# Patient Record
Sex: Female | Born: 1960 | Race: White | Hispanic: No | Marital: Married | Smoking: Never smoker
Health system: Southern US, Community
[De-identification: ages and names within clinical notes are randomized; demographics above are authoritative.]

## PROBLEM LIST (undated history)

## (undated) DIAGNOSIS — D689 Coagulation defect, unspecified: Secondary | ICD-10-CM

## (undated) DIAGNOSIS — C439 Malignant melanoma of skin, unspecified: Secondary | ICD-10-CM

## (undated) DIAGNOSIS — F419 Anxiety disorder, unspecified: Secondary | ICD-10-CM

## (undated) DIAGNOSIS — I10 Essential (primary) hypertension: Secondary | ICD-10-CM

## (undated) HISTORY — DX: Malignant melanoma of skin, unspecified: C43.9

## (undated) HISTORY — DX: Anxiety disorder, unspecified: F41.9

## (undated) HISTORY — DX: Coagulation defect, unspecified: D68.9

## (undated) HISTORY — PX: MELANOMA EXCISION: SHX5266

## (undated) HISTORY — DX: Essential (primary) hypertension: I10

## (undated) HISTORY — PX: HERNIA REPAIR: SHX51

## (undated) HISTORY — PX: OTHER SURGICAL HISTORY: SHX169

## (undated) HISTORY — PX: COLONOSCOPY: SHX174

---

## 2005-11-03 ENCOUNTER — Ambulatory Visit: Payer: Self-pay | Admitting: Internal Medicine

## 2009-02-20 ENCOUNTER — Encounter: Admission: RE | Admit: 2009-02-20 | Discharge: 2009-02-20 | Payer: Self-pay | Admitting: Obstetrics and Gynecology

## 2009-09-30 ENCOUNTER — Ambulatory Visit: Payer: Self-pay | Admitting: Internal Medicine

## 2009-09-30 ENCOUNTER — Encounter (INDEPENDENT_AMBULATORY_CARE_PROVIDER_SITE_OTHER): Payer: Self-pay | Admitting: *Deleted

## 2009-10-14 ENCOUNTER — Ambulatory Visit: Payer: Self-pay | Admitting: Internal Medicine

## 2009-10-14 LAB — HM COLONOSCOPY

## 2010-03-02 ENCOUNTER — Encounter: Admission: RE | Admit: 2010-03-02 | Discharge: 2010-03-02 | Payer: Self-pay | Admitting: Obstetrics and Gynecology

## 2010-08-19 ENCOUNTER — Encounter: Payer: Self-pay | Admitting: Internal Medicine

## 2010-12-22 NOTE — Letter (Signed)
Summary: [facility]   Imported By: Sherian Rein 08/25/2010 13:54:20  _____________________________________________________________________  External Attachment:    Type:   Image     Comment:   External Document

## 2010-12-24 ENCOUNTER — Ambulatory Visit (INDEPENDENT_AMBULATORY_CARE_PROVIDER_SITE_OTHER): Payer: Private Health Insurance - Indemnity | Admitting: Internal Medicine

## 2010-12-24 ENCOUNTER — Ambulatory Visit (INDEPENDENT_AMBULATORY_CARE_PROVIDER_SITE_OTHER)
Admission: RE | Admit: 2010-12-24 | Discharge: 2010-12-24 | Disposition: A | Discharge status: Home / Self Care | Payer: Private Health Insurance - Indemnity | Source: Ambulatory Visit | Attending: Internal Medicine | Admitting: Internal Medicine

## 2010-12-24 ENCOUNTER — Other Ambulatory Visit: Payer: Self-pay | Admitting: Internal Medicine

## 2010-12-24 ENCOUNTER — Encounter: Payer: Self-pay | Admitting: Internal Medicine

## 2010-12-24 DIAGNOSIS — M25519 Pain in unspecified shoulder: Secondary | ICD-10-CM

## 2011-01-07 NOTE — Assessment & Plan Note (Signed)
Summary: 3 MTH SHOULDER PAIN--LAST APPT W/DRAVP:  2006---[facility]   Vital Signs:  Patient profile:   50 year old female Height:      67 inches Weight:      179 pounds BMI:     28.14 Temp:     98.5 degrees F oral Pulse rate:   80 / minute Pulse rhythm:   regular Resp:     16 per minute BP sitting:   150 / 98  (right arm) Cuff size:   large  Vitals Entered By: Lanier Prude, CMA(AAMA) (December 24, 2010 9:43 AM) CC: Lt shoulder/arm  pain X 3 mo Is Patient Diabetic? No   CC:  Lt shoulder/arm  pain X 3 mo.  History of Present Illness: New pt to reest - last seen in 2006 C/o severe L shoulder pain irrad down the upper arm; it would  improve w/Aleve. she has been having it since Oct; pain getting worse. No injury. No fever or swelling  Preventive Screening-Counseling & Management  Alcohol-Tobacco     Smoking Status: never  Caffeine-Diet-Exercise     Does Patient Exercise: yes  Current Medications (verified): 1)  Vitamin B-12 Cr 2000 Mcg Cr-Tabs (Cyanocobalamin) .Marland Kitchen.. 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    Physical Exam  General:  Well-developed,well-nourished,in no acute distress; alert,appropriate and cooperative throughout examination Eyes:  No corneal or conjunctival inflammation noted. EOMI. Perrla.  Mouth:  Oral mucosa and oropharynx without lesions or exudates.  Teeth in good repair. Neck:   No deformities, masses, or tenderness noted. Lungs:  Normal respiratory effort, chest expands symmetrically. Lungs are clear to auscultation, no crackles or wheezes. Heart:  Normal rate and regular rhythm. S1 and S2 normal without gallop, murmur, click, rub or other extra sounds. Abdomen:  Bowel sounds positive,abdomen soft and non-tender without masses, organomegaly or hernias noted. Msk:  L subacr space is tender with palpation and ROM Neck WNL, more with abduction. Neurologic:  MS OK No atrophy DTRs ok Skin:  scar over L scapula   Impression & Recommendations:  Problem # 1:  SHOULDER PAIN (ICD-719.41) - subacr. bursitis Assessment New Options discussed: injection vs by mouth meds. She would like to try meds first. Her updated medication list for this problem includes:    Tramadol Hcl 50 Mg Tabs (Tramadol hcl) .Marland Kitchen... 1-2 tabs by mouth two times a day as needed pain Take Predn. 40mg  qd for 3 days, then 20 mg qd for 3 days, then 10mg  qd for 6 days, then stop. Take pc. Take Nexium 40 mg  1 a day x 2 wks Pennsaid  PT if needed Orders: T-Shoulder Left Min 2 Views (73030TC) Exercises provided  Problem # 2:  Preventive Health Care (ICD-V70.0) Assessment: Comment Only Labs and exams q 12 months w/Dr Edward Jolly  Complete Medication List: 1)  Prednisone 10 Mg Tabs (Prednisone) .... Take 40mg  qd for 3 days, then 20 mg qd for 3 days, then 10mg  qd for 6 days, then stop.  take pc. 2)  Tramadol Hcl 50 Mg Tabs (Tramadol hcl) .Marland Kitchen.. 1-2 tabs by mouth two times a day as needed pain 3)  Pennsaid 1.5 % Soln (Diclofenac sodium) .... 3-5 gtt on skin three times a day for pain 4)  Vitamin D 1000 Unit Tabs (Cholecalciferol) .Marland Kitchen.. 1 by mouth qd  Patient Instructions: 1)  Use stretching exercises that I have provided 2)  Please schedule a follow-up appointment in 1 month. Prescriptions: PENNSAID 1.5 % SOLN (DICLOFENAC SODIUM) 3-5 gtt on skin three times a day for pain  #1 x 3   Entered and Authorized by:    Tresa Garter MD   Signed by:   Tresa Garter MD on 12/24/2010   Method used:   Print then Give to Patient   RxID:   [id number] TRAMADOL HCL 50 MG TABS (TRAMADOL HCL) 1-2 tabs by mouth two times a day as needed pain  #100 x 1   Entered and Authorized by:   Tresa Garter MD   Signed by:   Tresa Garter MD on 12/24/2010   Method used:   Electronically to        [facility]. [phone]* (retail)       [address]       [place], [place]  [address]       Ph: [phone]       Fax: [phone]   RxID:   [id number] PREDNISONE 10 MG TABS (PREDNISONE) Take 40mg  qd for 3 days, then 20 mg qd for 3 days, then 10mg  qd for 6 days, then stop. Take pc.  #24 x 1   Entered and Authorized by:   Tresa Garter MD   Signed by:   Tresa Garter MD on 12/24/2010   Method used:   Electronically to        [facility]. [phone]* (retail)       [address]       [place], [place]  [address]       Ph: [phone]       Fax: [phone]   RxID:   [id number]    Orders Added: 1)  T-Shoulder Left Min 2 Views [73030TC] 2)  New Patient Level III [phone]

## 2011-01-25 ENCOUNTER — Ambulatory Visit: Payer: Private Health Insurance - Indemnity | Admitting: Internal Medicine

## 2011-02-26 ENCOUNTER — Other Ambulatory Visit: Payer: Self-pay | Admitting: Obstetrics and Gynecology

## 2011-02-26 DIAGNOSIS — Z1231 Encounter for screening mammogram for malignant neoplasm of breast: Secondary | ICD-10-CM

## 2011-03-01 ENCOUNTER — Ambulatory Visit: Payer: Private Health Insurance - Indemnity | Admitting: Internal Medicine

## 2011-03-22 ENCOUNTER — Ambulatory Visit
Admission: RE | Admit: 2011-03-22 | Discharge: 2011-03-22 | Disposition: A | Discharge status: Home / Self Care | Payer: Private Health Insurance - Indemnity | Source: Ambulatory Visit | Attending: Obstetrics and Gynecology | Admitting: Obstetrics and Gynecology

## 2011-03-22 DIAGNOSIS — Z1231 Encounter for screening mammogram for malignant neoplasm of breast: Secondary | ICD-10-CM

## 2011-05-10 ENCOUNTER — Telehealth: Payer: Self-pay

## 2011-05-10 NOTE — Telephone Encounter (Signed)
Patient called and lmovm stating that she was seen for shoulder pain (2 mos ago). She continues to have the pain and would like a referral to see a rheumatologist.Please advise Thanks

## 2011-05-10 NOTE — Telephone Encounter (Signed)
Let me see her for an OV pls

## 2011-05-11 NOTE — Telephone Encounter (Signed)
Returned call to patient//lmovm to call back to set appt with pcp

## 2011-06-28 ENCOUNTER — Ambulatory Visit: Payer: 59 | Attending: Family Medicine | Admitting: Physical Therapy

## 2011-06-28 DIAGNOSIS — IMO0001 Reserved for inherently not codable concepts without codable children: Secondary | ICD-10-CM | POA: Insufficient documentation

## 2011-06-28 DIAGNOSIS — M25619 Stiffness of unspecified shoulder, not elsewhere classified: Secondary | ICD-10-CM | POA: Insufficient documentation

## 2011-06-28 DIAGNOSIS — M6281 Muscle weakness (generalized): Secondary | ICD-10-CM | POA: Insufficient documentation

## 2011-06-28 DIAGNOSIS — M25519 Pain in unspecified shoulder: Secondary | ICD-10-CM | POA: Insufficient documentation

## 2011-07-06 ENCOUNTER — Ambulatory Visit: Payer: 59 | Admitting: Physical Therapy

## 2011-07-08 ENCOUNTER — Ambulatory Visit: Payer: 59 | Admitting: Physical Therapy

## 2011-07-13 ENCOUNTER — Ambulatory Visit: Payer: 59 | Admitting: Physical Therapy

## 2011-07-22 ENCOUNTER — Encounter: Payer: 59 | Admitting: Physical Therapy

## 2011-07-27 ENCOUNTER — Ambulatory Visit: Payer: 59 | Attending: Family Medicine | Admitting: Physical Therapy

## 2011-07-27 DIAGNOSIS — IMO0001 Reserved for inherently not codable concepts without codable children: Secondary | ICD-10-CM | POA: Insufficient documentation

## 2011-07-27 DIAGNOSIS — M6281 Muscle weakness (generalized): Secondary | ICD-10-CM | POA: Insufficient documentation

## 2011-07-27 DIAGNOSIS — M25619 Stiffness of unspecified shoulder, not elsewhere classified: Secondary | ICD-10-CM | POA: Insufficient documentation

## 2011-07-27 DIAGNOSIS — M25519 Pain in unspecified shoulder: Secondary | ICD-10-CM | POA: Insufficient documentation

## 2011-07-29 ENCOUNTER — Ambulatory Visit: Payer: 59 | Admitting: Physical Therapy

## 2011-08-03 ENCOUNTER — Encounter: Payer: 59 | Admitting: Physical Therapy

## 2011-08-05 ENCOUNTER — Encounter: Payer: 59 | Admitting: Physical Therapy

## 2011-08-12 ENCOUNTER — Ambulatory Visit: Payer: 59 | Admitting: Physical Therapy

## 2012-04-19 ENCOUNTER — Other Ambulatory Visit: Payer: Self-pay | Admitting: Family Medicine

## 2012-04-19 DIAGNOSIS — Z1231 Encounter for screening mammogram for malignant neoplasm of breast: Secondary | ICD-10-CM

## 2012-05-08 ENCOUNTER — Ambulatory Visit
Admission: RE | Admit: 2012-05-08 | Discharge: 2012-05-08 | Disposition: A | Discharge status: Home / Self Care | Payer: 59 | Source: Ambulatory Visit | Attending: Family Medicine | Admitting: Family Medicine

## 2012-05-08 DIAGNOSIS — Z1231 Encounter for screening mammogram for malignant neoplasm of breast: Secondary | ICD-10-CM

## 2013-04-13 ENCOUNTER — Other Ambulatory Visit: Payer: Self-pay

## 2013-04-13 DIAGNOSIS — Z1231 Encounter for screening mammogram for malignant neoplasm of breast: Secondary | ICD-10-CM

## 2013-06-04 ENCOUNTER — Ambulatory Visit
Admission: RE | Admit: 2013-06-04 | Discharge: 2013-06-04 | Disposition: A | Discharge status: Home / Self Care | Payer: 59 | Source: Ambulatory Visit

## 2013-06-04 DIAGNOSIS — Z1231 Encounter for screening mammogram for malignant neoplasm of breast: Secondary | ICD-10-CM

## 2014-06-12 ENCOUNTER — Other Ambulatory Visit: Payer: Self-pay

## 2014-06-12 DIAGNOSIS — Z1231 Encounter for screening mammogram for malignant neoplasm of breast: Secondary | ICD-10-CM

## 2014-06-19 ENCOUNTER — Ambulatory Visit
Admission: RE | Admit: 2014-06-19 | Discharge: 2014-06-19 | Disposition: A | Discharge status: Home / Self Care | Payer: 59 | Source: Ambulatory Visit

## 2014-06-19 ENCOUNTER — Encounter (INDEPENDENT_AMBULATORY_CARE_PROVIDER_SITE_OTHER): Payer: Self-pay

## 2014-06-19 DIAGNOSIS — Z1231 Encounter for screening mammogram for malignant neoplasm of breast: Secondary | ICD-10-CM

## 2015-05-28 ENCOUNTER — Other Ambulatory Visit: Payer: Self-pay

## 2015-05-28 DIAGNOSIS — Z1231 Encounter for screening mammogram for malignant neoplasm of breast: Secondary | ICD-10-CM

## 2015-06-19 ENCOUNTER — Encounter: Payer: Self-pay | Admitting: Internal Medicine

## 2015-06-30 ENCOUNTER — Ambulatory Visit
Admission: RE | Admit: 2015-06-30 | Discharge: 2015-06-30 | Disposition: A | Discharge status: Home / Self Care | Payer: 59 | Source: Ambulatory Visit

## 2015-06-30 DIAGNOSIS — Z1231 Encounter for screening mammogram for malignant neoplasm of breast: Secondary | ICD-10-CM

## 2015-07-02 ENCOUNTER — Other Ambulatory Visit: Payer: Self-pay | Admitting: Obstetrics and Gynecology

## 2015-07-02 DIAGNOSIS — R928 Other abnormal and inconclusive findings on diagnostic imaging of breast: Secondary | ICD-10-CM

## 2015-07-04 ENCOUNTER — Ambulatory Visit
Admission: RE | Admit: 2015-07-04 | Discharge: 2015-07-04 | Disposition: A | Discharge status: Home / Self Care | Payer: 59 | Source: Ambulatory Visit | Attending: Obstetrics and Gynecology | Admitting: Obstetrics and Gynecology

## 2015-07-04 DIAGNOSIS — R928 Other abnormal and inconclusive findings on diagnostic imaging of breast: Secondary | ICD-10-CM

## 2016-06-25 ENCOUNTER — Other Ambulatory Visit: Payer: Self-pay | Admitting: Family Medicine

## 2016-06-25 DIAGNOSIS — Z1231 Encounter for screening mammogram for malignant neoplasm of breast: Secondary | ICD-10-CM

## 2016-07-05 ENCOUNTER — Ambulatory Visit
Admission: RE | Admit: 2016-07-05 | Discharge: 2016-07-05 | Disposition: A | Discharge status: Home / Self Care | Payer: Managed Care, Other (non HMO) | Source: Ambulatory Visit | Attending: Family Medicine | Admitting: Family Medicine

## 2016-07-05 DIAGNOSIS — Z1231 Encounter for screening mammogram for malignant neoplasm of breast: Secondary | ICD-10-CM

## 2017-09-06 ENCOUNTER — Other Ambulatory Visit: Payer: Self-pay | Admitting: Family Medicine

## 2017-09-06 DIAGNOSIS — Z1231 Encounter for screening mammogram for malignant neoplasm of breast: Secondary | ICD-10-CM

## 2017-09-26 ENCOUNTER — Ambulatory Visit
Admission: RE | Admit: 2017-09-26 | Discharge: 2017-09-26 | Disposition: A | Discharge status: Home / Self Care | Payer: Managed Care, Other (non HMO) | Source: Ambulatory Visit | Attending: Family Medicine | Admitting: Family Medicine

## 2017-09-26 ENCOUNTER — Ambulatory Visit: Payer: Managed Care, Other (non HMO)

## 2017-09-26 DIAGNOSIS — Z1231 Encounter for screening mammogram for malignant neoplasm of breast: Secondary | ICD-10-CM

## 2018-01-05 DIAGNOSIS — Z79899 Other long term (current) drug therapy: Secondary | ICD-10-CM | POA: Diagnosis not present

## 2018-01-05 DIAGNOSIS — F4010 Social phobia, unspecified: Secondary | ICD-10-CM | POA: Diagnosis not present

## 2018-01-05 DIAGNOSIS — F902 Attention-deficit hyperactivity disorder, combined type: Secondary | ICD-10-CM | POA: Diagnosis not present

## 2018-03-27 DIAGNOSIS — F902 Attention-deficit hyperactivity disorder, combined type: Secondary | ICD-10-CM | POA: Diagnosis not present

## 2018-03-27 DIAGNOSIS — Z79899 Other long term (current) drug therapy: Secondary | ICD-10-CM | POA: Diagnosis not present

## 2018-05-08 DIAGNOSIS — D485 Neoplasm of uncertain behavior of skin: Secondary | ICD-10-CM | POA: Diagnosis not present

## 2018-05-09 DIAGNOSIS — D485 Neoplasm of uncertain behavior of skin: Secondary | ICD-10-CM | POA: Diagnosis not present

## 2018-06-16 DIAGNOSIS — S30861A Insect bite (nonvenomous) of abdominal wall, initial encounter: Secondary | ICD-10-CM | POA: Diagnosis not present

## 2018-06-16 DIAGNOSIS — M2550 Pain in unspecified joint: Secondary | ICD-10-CM | POA: Diagnosis not present

## 2018-06-27 DIAGNOSIS — F902 Attention-deficit hyperactivity disorder, combined type: Secondary | ICD-10-CM | POA: Diagnosis not present

## 2018-06-27 DIAGNOSIS — Z79899 Other long term (current) drug therapy: Secondary | ICD-10-CM | POA: Diagnosis not present

## 2018-06-27 DIAGNOSIS — F4010 Social phobia, unspecified: Secondary | ICD-10-CM | POA: Diagnosis not present

## 2018-07-11 DIAGNOSIS — L821 Other seborrheic keratosis: Secondary | ICD-10-CM | POA: Diagnosis not present

## 2018-07-11 DIAGNOSIS — L814 Other melanin hyperpigmentation: Secondary | ICD-10-CM | POA: Diagnosis not present

## 2018-07-11 DIAGNOSIS — D1801 Hemangioma of skin and subcutaneous tissue: Secondary | ICD-10-CM | POA: Diagnosis not present

## 2018-07-11 DIAGNOSIS — Z85820 Personal history of malignant melanoma of skin: Secondary | ICD-10-CM | POA: Diagnosis not present

## 2018-09-18 DIAGNOSIS — I10 Essential (primary) hypertension: Secondary | ICD-10-CM | POA: Diagnosis not present

## 2018-09-18 DIAGNOSIS — E559 Vitamin D deficiency, unspecified: Secondary | ICD-10-CM | POA: Diagnosis not present

## 2018-09-18 DIAGNOSIS — R7301 Impaired fasting glucose: Secondary | ICD-10-CM | POA: Diagnosis not present

## 2018-09-18 DIAGNOSIS — Z0000 Encounter for general adult medical examination without abnormal findings: Secondary | ICD-10-CM | POA: Diagnosis not present

## 2018-09-18 DIAGNOSIS — F339 Major depressive disorder, recurrent, unspecified: Secondary | ICD-10-CM | POA: Diagnosis not present

## 2018-09-22 ENCOUNTER — Other Ambulatory Visit: Payer: Self-pay | Admitting: Family Medicine

## 2018-09-22 DIAGNOSIS — Z1231 Encounter for screening mammogram for malignant neoplasm of breast: Secondary | ICD-10-CM

## 2018-09-27 DIAGNOSIS — Z79899 Other long term (current) drug therapy: Secondary | ICD-10-CM | POA: Diagnosis not present

## 2018-09-27 DIAGNOSIS — F902 Attention-deficit hyperactivity disorder, combined type: Secondary | ICD-10-CM | POA: Diagnosis not present

## 2018-09-27 DIAGNOSIS — F4010 Social phobia, unspecified: Secondary | ICD-10-CM | POA: Diagnosis not present

## 2018-11-03 ENCOUNTER — Ambulatory Visit
Admission: RE | Admit: 2018-11-03 | Discharge: 2018-11-03 | Disposition: A | Discharge status: Home / Self Care | Payer: BLUE CROSS/BLUE SHIELD | Source: Ambulatory Visit | Attending: Family Medicine | Admitting: Family Medicine

## 2018-11-03 DIAGNOSIS — Z1231 Encounter for screening mammogram for malignant neoplasm of breast: Secondary | ICD-10-CM | POA: Diagnosis not present

## 2018-11-29 DIAGNOSIS — Z6828 Body mass index (BMI) 28.0-28.9, adult: Secondary | ICD-10-CM | POA: Diagnosis not present

## 2018-11-29 DIAGNOSIS — Z01419 Encounter for gynecological examination (general) (routine) without abnormal findings: Secondary | ICD-10-CM | POA: Diagnosis not present

## 2018-11-29 DIAGNOSIS — Z124 Encounter for screening for malignant neoplasm of cervix: Secondary | ICD-10-CM | POA: Diagnosis not present

## 2019-01-03 DIAGNOSIS — I10 Essential (primary) hypertension: Secondary | ICD-10-CM | POA: Diagnosis not present

## 2019-01-03 DIAGNOSIS — F4010 Social phobia, unspecified: Secondary | ICD-10-CM | POA: Diagnosis not present

## 2019-01-03 DIAGNOSIS — Z79899 Other long term (current) drug therapy: Secondary | ICD-10-CM | POA: Diagnosis not present

## 2019-01-03 DIAGNOSIS — F902 Attention-deficit hyperactivity disorder, combined type: Secondary | ICD-10-CM | POA: Diagnosis not present

## 2019-03-26 DIAGNOSIS — E559 Vitamin D deficiency, unspecified: Secondary | ICD-10-CM | POA: Diagnosis not present

## 2019-03-26 DIAGNOSIS — I10 Essential (primary) hypertension: Secondary | ICD-10-CM | POA: Diagnosis not present

## 2019-03-26 DIAGNOSIS — R7301 Impaired fasting glucose: Secondary | ICD-10-CM | POA: Diagnosis not present

## 2019-03-26 DIAGNOSIS — F909 Attention-deficit hyperactivity disorder, unspecified type: Secondary | ICD-10-CM | POA: Diagnosis not present

## 2019-04-03 DIAGNOSIS — E559 Vitamin D deficiency, unspecified: Secondary | ICD-10-CM | POA: Diagnosis not present

## 2019-04-03 DIAGNOSIS — F4010 Social phobia, unspecified: Secondary | ICD-10-CM | POA: Diagnosis not present

## 2019-04-03 DIAGNOSIS — E7800 Pure hypercholesterolemia, unspecified: Secondary | ICD-10-CM | POA: Diagnosis not present

## 2019-04-03 DIAGNOSIS — Z79899 Other long term (current) drug therapy: Secondary | ICD-10-CM | POA: Diagnosis not present

## 2019-04-03 DIAGNOSIS — S30864A Insect bite (nonvenomous) of vagina and vulva, initial encounter: Secondary | ICD-10-CM | POA: Diagnosis not present

## 2019-04-03 DIAGNOSIS — R7301 Impaired fasting glucose: Secondary | ICD-10-CM | POA: Diagnosis not present

## 2019-04-03 DIAGNOSIS — F902 Attention-deficit hyperactivity disorder, combined type: Secondary | ICD-10-CM | POA: Diagnosis not present

## 2019-04-06 DIAGNOSIS — E559 Vitamin D deficiency, unspecified: Secondary | ICD-10-CM | POA: Diagnosis not present

## 2019-04-06 DIAGNOSIS — R7301 Impaired fasting glucose: Secondary | ICD-10-CM | POA: Diagnosis not present

## 2019-04-06 DIAGNOSIS — E7800 Pure hypercholesterolemia, unspecified: Secondary | ICD-10-CM | POA: Diagnosis not present

## 2019-04-06 DIAGNOSIS — Z8249 Family history of ischemic heart disease and other diseases of the circulatory system: Secondary | ICD-10-CM | POA: Diagnosis not present

## 2019-07-10 DIAGNOSIS — Z79899 Other long term (current) drug therapy: Secondary | ICD-10-CM | POA: Diagnosis not present

## 2019-07-10 DIAGNOSIS — F902 Attention-deficit hyperactivity disorder, combined type: Secondary | ICD-10-CM | POA: Diagnosis not present

## 2019-07-16 DIAGNOSIS — B078 Other viral warts: Secondary | ICD-10-CM | POA: Diagnosis not present

## 2019-07-16 DIAGNOSIS — Z85820 Personal history of malignant melanoma of skin: Secondary | ICD-10-CM | POA: Diagnosis not present

## 2019-07-16 DIAGNOSIS — L821 Other seborrheic keratosis: Secondary | ICD-10-CM | POA: Diagnosis not present

## 2019-07-16 DIAGNOSIS — L814 Other melanin hyperpigmentation: Secondary | ICD-10-CM | POA: Diagnosis not present

## 2019-09-12 ENCOUNTER — Encounter: Payer: Self-pay | Admitting: Gastroenterology

## 2019-09-20 DIAGNOSIS — R7301 Impaired fasting glucose: Secondary | ICD-10-CM | POA: Diagnosis not present

## 2019-09-20 DIAGNOSIS — E7800 Pure hypercholesterolemia, unspecified: Secondary | ICD-10-CM | POA: Diagnosis not present

## 2019-09-20 DIAGNOSIS — E559 Vitamin D deficiency, unspecified: Secondary | ICD-10-CM | POA: Diagnosis not present

## 2019-09-20 DIAGNOSIS — I10 Essential (primary) hypertension: Secondary | ICD-10-CM | POA: Diagnosis not present

## 2019-10-09 DIAGNOSIS — Z20828 Contact with and (suspected) exposure to other viral communicable diseases: Secondary | ICD-10-CM | POA: Diagnosis not present

## 2019-10-10 DIAGNOSIS — Z79899 Other long term (current) drug therapy: Secondary | ICD-10-CM | POA: Diagnosis not present

## 2019-10-10 DIAGNOSIS — F902 Attention-deficit hyperactivity disorder, combined type: Secondary | ICD-10-CM | POA: Diagnosis not present

## 2020-01-26 ENCOUNTER — Ambulatory Visit: Payer: Self-pay | Attending: Internal Medicine

## 2020-01-26 DIAGNOSIS — Z23 Encounter for immunization: Secondary | ICD-10-CM | POA: Insufficient documentation

## 2020-01-26 NOTE — Progress Notes (Signed)
   Covid-19 Vaccination Clinic  Name:  Regina Parks    MRN: [id number] DOB: [birth year]  01/26/2020  [name] was observed post Covid-19 immunization for 15 minutes without incident. She was provided with Vaccine Information Sheet and instruction to access the V-Safe system.   [name] was instructed to call 911 with any severe reactions post vaccine: Marland Kitchen Difficulty breathing  . Swelling of face and throat  . A fast heartbeat  . A bad rash all over body  . Dizziness and weakness   Immunizations Administered    Name Date Dose VIS Date Route   Pfizer COVID-19 Vaccine 01/26/2020  7:08 PM 0.3 mL 11/02/2019 Intramuscular   Manufacturer: [place]   Lot: [id number]   [place]: [id number]

## 2020-02-16 ENCOUNTER — Ambulatory Visit: Payer: Self-pay | Attending: Internal Medicine

## 2020-02-16 ENCOUNTER — Ambulatory Visit: Payer: Self-pay

## 2020-02-16 DIAGNOSIS — Z23 Encounter for immunization: Secondary | ICD-10-CM

## 2020-02-16 NOTE — Progress Notes (Signed)
   Covid-19 Vaccination Clinic  Name:  Tomekia Biskner    MRN: [id number] DOB: 1961/03/08  02/16/2020  Ms. Javorsky was observed post Covid-19 immunization for 15 minutes without incident. She was provided with Vaccine Information Sheet and instruction to access the V-Safe system.   Ms. Gerl was instructed to call 911 with any severe reactions post vaccine: Marland Kitchen Difficulty breathing  . Swelling of face and throat  . A fast heartbeat  . A bad rash all over body  . Dizziness and weakness   Immunizations Administered    Name Date Dose VIS Date Route   Pfizer COVID-19 Vaccine 02/16/2020  1:59 PM 0.3 mL 11/02/2019 Intramuscular   Manufacturer: [employer], [employer]   Lot: [id number]   [place]: [id number]

## 2020-02-27 ENCOUNTER — Ambulatory Visit: Payer: Self-pay

## 2020-08-06 ENCOUNTER — Encounter (HOSPITAL_BASED_OUTPATIENT_CLINIC_OR_DEPARTMENT_OTHER): Payer: Self-pay

## 2020-08-12 ENCOUNTER — Other Ambulatory Visit (HOSPITAL_BASED_OUTPATIENT_CLINIC_OR_DEPARTMENT_OTHER): Payer: Self-pay | Admitting: Family Medicine

## 2020-08-12 DIAGNOSIS — Z1231 Encounter for screening mammogram for malignant neoplasm of breast: Secondary | ICD-10-CM

## 2020-08-13 ENCOUNTER — Other Ambulatory Visit: Payer: Self-pay

## 2020-08-13 ENCOUNTER — Ambulatory Visit (HOSPITAL_BASED_OUTPATIENT_CLINIC_OR_DEPARTMENT_OTHER)
Admission: RE | Admit: 2020-08-13 | Discharge: 2020-08-13 | Disposition: A | Discharge status: Home / Self Care | Payer: 59 | Source: Ambulatory Visit | Attending: Family Medicine | Admitting: Family Medicine

## 2020-08-13 DIAGNOSIS — Z1231 Encounter for screening mammogram for malignant neoplasm of breast: Secondary | ICD-10-CM | POA: Diagnosis present

## 2021-11-05 ENCOUNTER — Other Ambulatory Visit (HOSPITAL_BASED_OUTPATIENT_CLINIC_OR_DEPARTMENT_OTHER): Payer: Self-pay | Admitting: Family Medicine

## 2021-11-05 DIAGNOSIS — Z1231 Encounter for screening mammogram for malignant neoplasm of breast: Secondary | ICD-10-CM

## 2021-11-10 ENCOUNTER — Ambulatory Visit (HOSPITAL_BASED_OUTPATIENT_CLINIC_OR_DEPARTMENT_OTHER)
Admission: RE | Admit: 2021-11-10 | Discharge: 2021-11-10 | Disposition: A | Discharge status: Home / Self Care | Payer: 59 | Source: Ambulatory Visit | Attending: Family Medicine | Admitting: Family Medicine

## 2021-11-10 ENCOUNTER — Other Ambulatory Visit: Payer: Self-pay

## 2021-11-10 ENCOUNTER — Encounter (HOSPITAL_BASED_OUTPATIENT_CLINIC_OR_DEPARTMENT_OTHER): Payer: Self-pay

## 2021-11-10 DIAGNOSIS — Z1231 Encounter for screening mammogram for malignant neoplasm of breast: Secondary | ICD-10-CM | POA: Diagnosis present

## 2022-09-09 DIAGNOSIS — Z79899 Other long term (current) drug therapy: Secondary | ICD-10-CM | POA: Diagnosis not present

## 2023-02-04 ENCOUNTER — Ambulatory Visit (HOSPITAL_BASED_OUTPATIENT_CLINIC_OR_DEPARTMENT_OTHER)
Admission: RE | Admit: 2023-02-04 | Discharge: 2023-02-04 | Disposition: A | Discharge status: Home / Self Care | Payer: 59 | Source: Ambulatory Visit | Attending: Family Medicine | Admitting: Family Medicine

## 2023-02-04 ENCOUNTER — Other Ambulatory Visit (HOSPITAL_BASED_OUTPATIENT_CLINIC_OR_DEPARTMENT_OTHER): Payer: Self-pay

## 2023-02-04 DIAGNOSIS — Z1231 Encounter for screening mammogram for malignant neoplasm of breast: Secondary | ICD-10-CM

## 2023-05-05 DIAGNOSIS — Z79899 Other long term (current) drug therapy: Secondary | ICD-10-CM | POA: Diagnosis not present

## 2023-05-05 DIAGNOSIS — F902 Attention-deficit hyperactivity disorder, combined type: Secondary | ICD-10-CM | POA: Diagnosis not present

## 2023-09-05 DIAGNOSIS — E7800 Pure hypercholesterolemia, unspecified: Secondary | ICD-10-CM | POA: Diagnosis not present

## 2023-09-12 DIAGNOSIS — Z1212 Encounter for screening for malignant neoplasm of rectum: Secondary | ICD-10-CM | POA: Diagnosis not present

## 2023-09-12 DIAGNOSIS — Z1211 Encounter for screening for malignant neoplasm of colon: Secondary | ICD-10-CM | POA: Diagnosis not present

## 2023-10-06 ENCOUNTER — Encounter: Payer: Self-pay | Admitting: Gastroenterology

## 2023-10-19 DIAGNOSIS — F902 Attention-deficit hyperactivity disorder, combined type: Secondary | ICD-10-CM | POA: Diagnosis not present

## 2023-11-25 ENCOUNTER — Ambulatory Visit (AMBULATORY_SURGERY_CENTER): Payer: BC Managed Care – PPO | Admitting: *Deleted

## 2023-11-25 VITALS — Ht 67.0 in | Wt 180.0 lb

## 2023-11-25 DIAGNOSIS — R195 Other fecal abnormalities: Secondary | ICD-10-CM

## 2023-11-25 DIAGNOSIS — F419 Anxiety disorder, unspecified: Secondary | ICD-10-CM | POA: Insufficient documentation

## 2023-11-25 DIAGNOSIS — R6882 Decreased libido: Secondary | ICD-10-CM | POA: Insufficient documentation

## 2023-11-25 DIAGNOSIS — R059 Cough, unspecified: Secondary | ICD-10-CM | POA: Insufficient documentation

## 2023-11-25 DIAGNOSIS — N951 Menopausal and female climacteric states: Secondary | ICD-10-CM | POA: Insufficient documentation

## 2023-11-25 NOTE — Progress Notes (Signed)
 Pt's name and DOB verified at the beginning of the pre-visit wit 2 identifiers  Pt denies any difficulty with ambulating,sitting, laying down or rolling side to side  Pt has no issues with ambulation   Pt has no issues moving head neck or swallowing  No egg or soy allergy known to patient   No issues known to pt with past sedation with any surgeries or procedures  Pt denies having issues being intubated  No FH of Malignant Hyperthermia  Pt is not on diet pills or shots  Pt is not on home 02   Pt is not on blood thinners    Pt has frequent issues with constipation RN instructed pt to use Miralax per bottles instructions a week before prep days. Pt states they will  Pt is not on dialysis  Pt denise any abnormal heart rhythms   Pt denies any upcoming cardiac testing  Pt encouraged to use to use Singlecare or Goodrx to reduce cost   Patient's chart reviewed by Norleen Schillings CNRA prior to pre-visit and patient appropriate for the [facility].  Pre-visit completed and red dot placed by patient's name on their procedure day (on provider's schedule).  .  Visit by phone  Pt states weight is 180lb  Instructed pt why it is important to and  to call if they have any changes in health or new medications. Directed them to the # given and on instructions.     Instructions reviewed. Pt given both [facility] main # and MD on call # prior to instructions.  Pt states understanding. Instructed to review again prior to procedure. Pt states they will.   Instructions sent by mail with coupon and by My Chart  Coupon sent via text to mobile phone and pt verified they received it'

## 2023-12-02 ENCOUNTER — Encounter: Payer: Self-pay | Admitting: Gastroenterology

## 2023-12-08 ENCOUNTER — Encounter: Payer: 59 | Admitting: Gastroenterology

## 2024-11-12 ENCOUNTER — Other Ambulatory Visit (HOSPITAL_BASED_OUTPATIENT_CLINIC_OR_DEPARTMENT_OTHER): Payer: Self-pay | Admitting: Family Medicine

## 2024-11-12 ENCOUNTER — Other Ambulatory Visit: Payer: Self-pay | Admitting: Medical Genetics

## 2024-11-12 DIAGNOSIS — Z1231 Encounter for screening mammogram for malignant neoplasm of breast: Secondary | ICD-10-CM

## 2024-11-13 ENCOUNTER — Ambulatory Visit (HOSPITAL_BASED_OUTPATIENT_CLINIC_OR_DEPARTMENT_OTHER)
Admission: RE | Admit: 2024-11-13 | Discharge: 2024-11-13 | Disposition: A | Discharge status: Home / Self Care | Source: Ambulatory Visit | Attending: Family Medicine | Admitting: Family Medicine

## 2024-11-13 ENCOUNTER — Encounter (HOSPITAL_BASED_OUTPATIENT_CLINIC_OR_DEPARTMENT_OTHER): Payer: Self-pay | Admitting: Radiology

## 2024-11-13 DIAGNOSIS — Z1231 Encounter for screening mammogram for malignant neoplasm of breast: Secondary | ICD-10-CM | POA: Diagnosis present
